# Patient Record
Sex: Female | Born: 1990
Health system: Southern US, Community
[De-identification: ages and names within clinical notes are randomized; demographics above are authoritative.]

## PROBLEM LIST (undated history)

## (undated) DIAGNOSIS — J45909 Unspecified asthma, uncomplicated: Secondary | ICD-10-CM

## (undated) DIAGNOSIS — J302 Other seasonal allergic rhinitis: Secondary | ICD-10-CM

---
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
## 2020-04-16 ENCOUNTER — Other Ambulatory Visit: Payer: Self-pay | Admitting: Critical Care Medicine

## 2020-04-16 ENCOUNTER — Other Ambulatory Visit: Payer: Self-pay

## 2020-04-16 DIAGNOSIS — Z20822 Contact with and (suspected) exposure to covid-19: Secondary | ICD-10-CM

## 2020-04-17 LAB — SARS-COV-2, NAA 2 DAY TAT

## 2020-04-17 LAB — NOVEL CORONAVIRUS, NAA: SARS-CoV-2, NAA: NOT DETECTED

## 2020-11-07 ENCOUNTER — Emergency Department (HOSPITAL_COMMUNITY)
Admission: EM | Admit: 2020-11-07 | Discharge: 2020-11-07 | Disposition: A | Discharge status: Home / Self Care | Payer: 59 | Attending: Emergency Medicine | Admitting: Emergency Medicine

## 2020-11-07 ENCOUNTER — Encounter (HOSPITAL_COMMUNITY): Payer: Self-pay

## 2020-11-07 ENCOUNTER — Other Ambulatory Visit: Payer: Self-pay

## 2020-11-07 DIAGNOSIS — R059 Cough, unspecified: Secondary | ICD-10-CM | POA: Diagnosis present

## 2020-11-07 DIAGNOSIS — Z20822 Contact with and (suspected) exposure to covid-19: Secondary | ICD-10-CM | POA: Insufficient documentation

## 2020-11-07 DIAGNOSIS — J45901 Unspecified asthma with (acute) exacerbation: Secondary | ICD-10-CM | POA: Diagnosis not present

## 2020-11-07 HISTORY — DX: Other seasonal allergic rhinitis: J30.2

## 2020-11-07 HISTORY — DX: Unspecified asthma, uncomplicated: J45.909

## 2020-11-07 LAB — RESP PANEL BY RT-PCR (FLU A&B, COVID) ARPGX2
Influenza A by PCR: NEGATIVE
Influenza B by PCR: NEGATIVE
SARS Coronavirus 2 by RT PCR: NEGATIVE

## 2020-11-07 MED ORDER — ALBUTEROL SULFATE HFA 108 (90 BASE) MCG/ACT IN AERS
2.0000 | INHALATION_SPRAY | Freq: Once | RESPIRATORY_TRACT | Status: AC
  Administered 2020-11-07: 2 via RESPIRATORY_TRACT
  Filled 2020-11-07: qty 6.7

## 2020-11-07 MED ORDER — ALBUTEROL SULFATE (2.5 MG/3ML) 0.083% IN NEBU
2.5000 mg | INHALATION_SOLUTION | Freq: Once | RESPIRATORY_TRACT | Status: AC
  Administered 2020-11-07: 2.5 mg via RESPIRATORY_TRACT

## 2020-11-07 MED ORDER — DEXAMETHASONE 10 MG/ML FOR PEDIATRIC ORAL USE
10.0000 mg | Freq: Once | INTRAMUSCULAR | Status: AC
  Administered 2020-11-07: 10 mg via ORAL

## 2020-11-07 MED ORDER — IPRATROPIUM-ALBUTEROL 0.5-2.5 (3) MG/3ML IN SOLN
3.0000 mL | Freq: Once | RESPIRATORY_TRACT | Status: AC
  Administered 2020-11-07: 3 mL via RESPIRATORY_TRACT
  Filled 2020-11-07: qty 3

## 2020-11-07 MED ORDER — PREDNISONE 20 MG PO TABS: 20.0000 mg | ORAL_TABLET | Freq: Two times a day (BID) | ORAL | 0 refills | Status: AC

## 2020-11-07 MED ORDER — ALBUTEROL SULFATE (2.5 MG/3ML) 0.083% IN NEBU
2.5000 mg | INHALATION_SOLUTION | Freq: Once | RESPIRATORY_TRACT | Status: AC
  Administered 2020-11-07: 2.5 mg via RESPIRATORY_TRACT
  Filled 2020-11-07: qty 3

## 2020-11-07 MED ORDER — ALBUTEROL SULFATE HFA 108 (90 BASE) MCG/ACT IN AERS
2.0000 | INHALATION_SPRAY | RESPIRATORY_TRACT | Status: DC | PRN
  Filled 2020-11-07: qty 6.7

## 2020-11-07 NOTE — ED Triage Notes (Signed)
Pt woke up to Sob and states "im having asthma flare up". Pt states she hasnt had one in years and has not taken any neb tx or inhaler.

## 2020-11-07 NOTE — Discharge Instructions (Addendum)
Call your primary care doctor or specialist as discussed in the next 2-3 days.   Return immediately back to the ER if:  Your symptoms worsen within the next 12-24 hours. You develop new symptoms such as new fevers, persistent vomiting, new pain, shortness of breath, or new weakness or numbness, or if you have any other concerns.  

## 2020-11-07 NOTE — ED Provider Notes (Signed)
[facility] EMERGENCY DEPARTMENT Provider Note   CSN: [id number] Arrival date & time: 11/07/20  4492     History Chief Complaint  Patient presents with  . Asthma    Erin Frank is a 30 y.o. female.  Patient presents chief complaint of asthma exacerbation.  She says she has had severe asthma episodes once every few years.  She has been doing well for the last several months and no longer using her inhaler at home.  States she has had a mild cough otherwise denies fevers.  No vomiting no diarrhea complaining of tightness when she takes a deep breath.        Past Medical History:  Diagnosis Date  . Asthma   . Seasonal allergies     There are no problems to display for this patient.   History reviewed. No pertinent surgical history.   OB History   No obstetric history on file.     History reviewed. No pertinent family history.  Social History   Tobacco Use  . Smoking status: Never Smoker  . Smokeless tobacco: Never Used  Substance Use Topics  . Alcohol use: Not Currently  . Drug use: Never    Home Medications Prior to Admission medications   Medication Sig Start Date End Date Taking? Authorizing Provider  predniSONE (DELTASONE) 20 MG tablet Take 1 tablet (20 mg total) by mouth 2 (two) times daily with a meal for 5 days. 11/07/20 11/12/20 Yes Cheryll Cockayne, MD    Allergies    Patient has no known allergies.  Review of Systems   Review of Systems  Constitutional: Negative for fever.  HENT: Negative for ear pain.   Eyes: Negative for pain.  Respiratory: Positive for shortness of breath.   Cardiovascular: Negative for chest pain.  Gastrointestinal: Negative for abdominal pain.  Genitourinary: Negative for flank pain.  Musculoskeletal: Negative for back pain.  Skin: Negative for rash.  Neurological: Negative for headaches.    Physical Exam Updated Vital Signs BP 120/71   Pulse (!) 129   Temp 98 F (36.7 C)   Resp (!) 25    Ht 5\' 5"  (1.651 m)   Wt 77.1 kg   SpO2 96%   BMI 28.29 kg/m   Physical Exam Constitutional:      General: She is not in acute distress.    Appearance: Normal appearance.  HENT:     Head: Normocephalic.     Nose: Nose normal.  Eyes:     Extraocular Movements: Extraocular movements intact.  Cardiovascular:     Rate and Rhythm: Normal rate.  Pulmonary:     Breath sounds: Wheezing present.  Musculoskeletal:        General: Normal range of motion.     Cervical back: Normal range of motion.  Neurological:     General: No focal deficit present.     Mental Status: She is alert. Mental status is at baseline.     ED Results / Procedures / Treatments   Labs (all labs ordered are listed, but only abnormal results are displayed) Labs Reviewed  RESP PANEL BY RT-PCR (FLU A&B, COVID) [id number]    EKG None  Radiology No results found.  Procedures .Critical Care Performed by: , MD Authorized by: Cheryll Cockayne, MD   Critical care provider statement:    Critical care time (minutes):  30   Critical care time was exclusive of:  Separately billable procedures and treating other patients   Critical  care was necessary to treat or prevent imminent or life-threatening deterioration of the following conditions:  Respiratory failure Comments:     Patient with moderate to severe asthma exacerbation requiring 3 breathing treatments.     Medications Ordered in ED Medications  albuterol (VENTOLIN HFA) 108 (90 Base) MCG/ACT inhaler 2 puff (has no administration in time range)  dexamethasone (DECADRON) 10 MG/ML injection for Pediatric ORAL use 10 mg (10 mg Oral Given 11/07/20 0834)  albuterol (VENTOLIN HFA) 108 (90 Base) MCG/ACT inhaler 2 puff (2 puffs Inhalation Given 11/07/20 0800)  ipratropium-albuterol (DUONEB) 0.5-2.5 (3) MG/3ML nebulizer solution 3 mL (3 mLs Nebulization Given 11/07/20 0937)  albuterol (PROVENTIL) (2.5 MG/3ML) 0.083% nebulizer solution 2.5 mg (2.5 mg  Nebulization Given 11/07/20 1002)  albuterol (PROVENTIL) (2.5 MG/3ML) 0.083% nebulizer solution 2.5 mg (2.5 mg Nebulization Given 11/07/20 1053)    ED Course  I have reviewed the triage vital signs and the nursing notes.  Pertinent labs & imaging results that were available during my care of the patient were reviewed by me and considered in my medical decision making (see chart for details).    MDM Rules/Calculators/A&P                          Patient presents with wheezes bilaterally.  Moderately increased work of breathing.  Given Decadron and Covid swab.  Given albuterol treatments with improvement.  Patient appears improved with multiple breathing treatments while in the ER.  Recommend outpatient follow-up with her doctor in 3 to 4 days.  Recommend immediate return for worsening symptoms or any additional concerns.  Final Clinical Impression(s) / ED Diagnoses Final diagnoses:  Severe asthma with exacerbation, unspecified whether persistent    Rx / DC Orders ED Discharge Orders         Ordered    predniSONE (DELTASONE) 20 MG tablet  2 times daily with meals        11/07/20 1055           [place], Eustace Moore, MD 11/07/20 1055

## 2021-08-12 ENCOUNTER — Encounter (HOSPITAL_COMMUNITY): Payer: Self-pay

## 2021-08-12 ENCOUNTER — Other Ambulatory Visit: Payer: Self-pay

## 2021-08-12 ENCOUNTER — Emergency Department (HOSPITAL_COMMUNITY): Payer: No Typology Code available for payment source

## 2021-08-12 ENCOUNTER — Emergency Department (HOSPITAL_COMMUNITY)
Admission: EM | Admit: 2021-08-12 | Discharge: 2021-08-12 | Discharge status: Against Medical Advice | Payer: No Typology Code available for payment source | Attending: Emergency Medicine | Admitting: Emergency Medicine

## 2021-08-12 DIAGNOSIS — Z5321 Procedure and treatment not carried out due to patient leaving prior to being seen by health care provider: Secondary | ICD-10-CM | POA: Diagnosis not present

## 2021-08-12 DIAGNOSIS — U071 COVID-19: Secondary | ICD-10-CM | POA: Diagnosis not present

## 2021-08-12 DIAGNOSIS — R059 Cough, unspecified: Secondary | ICD-10-CM | POA: Diagnosis present

## 2021-08-12 LAB — COMPREHENSIVE METABOLIC PANEL
ALT: 16 U/L (ref 0–44)
AST: 16 U/L (ref 15–41)
Albumin: 3.9 g/dL (ref 3.5–5.0)
Alkaline Phosphatase: 58 U/L (ref 38–126)
Anion gap: 10 (ref 5–15)
BUN: 8 mg/dL (ref 6–20)
CO2: 18 mmol/L — ABNORMAL LOW (ref 22–32)
Calcium: 9 mg/dL (ref 8.9–10.3)
Chloride: 106 mmol/L (ref 98–111)
Creatinine, Ser: 0.84 mg/dL (ref 0.44–1.00)
GFR, Estimated: >60 mL/min (ref >60)
Glucose, Bld: 95 mg/dL (ref 70–99)
Potassium: 4 mmol/L (ref 3.5–5.1)
Sodium: 134 mmol/L — ABNORMAL LOW (ref 135–145)
Total Bilirubin: 0.8 mg/dL (ref 0.3–1.2)
Total Protein: 7.8 g/dL (ref 6.5–8.1)

## 2021-08-12 LAB — CBC WITH DIFFERENTIAL/PLATELET
Abs Immature Granulocytes: 0.03 10*3/uL (ref 0.00–0.07)
Basophils Absolute: 0 10*3/uL (ref 0.0–0.1)
Basophils Relative: 1 %
Eosinophils Absolute: 0 10*3/uL (ref 0.0–0.5)
Eosinophils Relative: 1 %
HCT: 40.5 % (ref 36.0–46.0)
Hemoglobin: 14.2 g/dL (ref 12.0–15.0)
Immature Granulocytes: 1 %
Lymphocytes Relative: 7 %
Lymphs Abs: 0.4 10*3/uL — ABNORMAL LOW (ref 0.7–4.0)
MCH: 32.8 pg (ref 26.0–34.0)
MCHC: 35.1 g/dL (ref 30.0–36.0)
MCV: 93.5 fL (ref 80.0–100.0)
Monocytes Absolute: 0.4 10*3/uL (ref 0.1–1.0)
Monocytes Relative: 7 %
Neutro Abs: 4.9 10*3/uL (ref 1.7–7.7)
Neutrophils Relative %: 83 %
Platelets: 294 10*3/uL (ref 150–400)
RBC: 4.33 MIL/uL (ref 3.87–5.11)
RDW: 12.9 % (ref 11.5–15.5)
WBC: 5.8 10*3/uL (ref 4.0–10.5)
nRBC: 0 % (ref 0.0–0.2)

## 2021-08-12 LAB — RESP PANEL BY RT-PCR (FLU A&B, COVID) ARPGX2
Influenza A by PCR: NEGATIVE
Influenza B by PCR: NEGATIVE
SARS Coronavirus 2 by RT PCR: POSITIVE — AB

## 2021-08-12 LAB — TROPONIN I (HIGH SENSITIVITY): Troponin I (High Sensitivity): <2 ng/L (ref <18)

## 2021-08-12 LAB — I-STAT BETA HCG BLOOD, ED (MC, WL, AP ONLY): I-stat hCG, quantitative: <5 m[IU]/mL (ref <5)

## 2021-08-12 MED ORDER — ALBUTEROL SULFATE HFA 108 (90 BASE) MCG/ACT IN AERS
6.0000 | INHALATION_SPRAY | Freq: Once | RESPIRATORY_TRACT | Status: AC
  Administered 2021-08-12: 13:00:00 6 via RESPIRATORY_TRACT
  Filled 2021-08-12: qty 6.7

## 2021-08-12 MED ORDER — IPRATROPIUM-ALBUTEROL 0.5-2.5 (3) MG/3ML IN SOLN
3.0000 mL | Freq: Once | RESPIRATORY_TRACT | Status: DC
  Filled 2021-08-12: qty 3

## 2021-08-12 MED ORDER — ACETAMINOPHEN 325 MG PO TABS
650.0000 mg | ORAL_TABLET | Freq: Once | ORAL | Status: AC
  Administered 2021-08-12: 13:00:00 650 mg via ORAL
  Filled 2021-08-12: qty 2

## 2021-08-12 MED ORDER — ALBUTEROL SULFATE (2.5 MG/3ML) 0.083% IN NEBU
2.5000 mg | INHALATION_SOLUTION | Freq: Once | RESPIRATORY_TRACT | Status: DC
  Filled 2021-08-12: qty 3

## 2021-08-12 MED ORDER — AEROCHAMBER PLUS FLO-VU LARGE MISC: 1.0000 | Freq: Once | Status: DC

## 2021-08-12 NOTE — ED Notes (Signed)
Called pt x3 for vitals recheck, no response.  ?

## 2021-08-12 NOTE — ED Notes (Signed)
Called pt again x3 for vitals recheck, still no response. Moving pt OTF. 

## 2021-08-12 NOTE — ED Triage Notes (Signed)
Patient with cough and congestion that started yesterday and this am complains of asthma exacerbation. Used inhaler with ,minimal relief. Complains of chills with same. Alert and oriented

## 2021-08-12 NOTE — ED Provider Notes (Signed)
Emergency Medicine Provider Triage Evaluation Note  Erin Frank , a 30 y.o. female  was evaluated in triage.  Pt complains of feeling poorly. She states that she had cough and congestion that started yesterday.  This morning at about 8 she used her asthma inhaler, she is 2 puffs without relief.  She reports chills.  She has not had flu or COVID and is not vaccinated against either.  She states this feels different than an asthma exacerbation.  She reports generalized body aches.  Review of Systems  Positive: Shortness of breath, lightheadedness body aches Negative: Abdominal pain  Physical Exam  BP 115/77 (BP Location: Left Arm)    Pulse (!) 107    Temp 99.6 F (37.6 C) (Oral)    Resp 20    SpO2 99%  Gen:   Awake, no distress   Resp:  Increased effort.  Lung sounds are not significantly diminished and there is not significant wheezing bilaterally MSK:   Moves extremities without difficulty  Other:  Patient appears to feel unwell.  Medical Decision Making  Medically screening exam initiated at 1:01 PM.  Appropriate orders placed.  Erin Frank was informed that the remainder of the evaluation will be completed by another provider, this initial triage assessment does not replace that evaluation, and the importance of remaining in the ED until their evaluation is complete.  States that she started feeling poorly last night with cough and congestion.  Here she is tachycardic, her temperature is 99.6, given is the rest of her symptoms seem consistent with a influenza-like illness I question if she is actually febrile. Her lungs do not have significant diminished airflow or wheezing bilaterally and she states this feels different than her usual asthma exacerbations. We will give multiple puffs of albuterol inhaler with spacer ordered.  Additionally will treat with Tylenol as a suspect she may be febrile.  Labs and chest x-ray are ordered.  Flu and COVID testing is ordered.    Note:  Portions of this report may have been transcribed using voice recognition software. Every effort was made to ensure accuracy; however, inadvertent computerized transcription errors may be present     Erin Frank 08/12/21 1304    Wynetta Fines, MD 08/14/21 [phone]

## 2022-01-19 ENCOUNTER — Other Ambulatory Visit: Payer: Self-pay | Admitting: Obstetrics and Gynecology

## 2022-01-19 ENCOUNTER — Other Ambulatory Visit (HOSPITAL_COMMUNITY)
Admission: RE | Admit: 2022-01-19 | Discharge: 2022-01-19 | Disposition: A | Discharge status: Home / Self Care | Payer: No Typology Code available for payment source | Source: Ambulatory Visit | Attending: Obstetrics and Gynecology | Admitting: Obstetrics and Gynecology

## 2022-01-19 DIAGNOSIS — Z01419 Encounter for gynecological examination (general) (routine) without abnormal findings: Secondary | ICD-10-CM | POA: Diagnosis present

## 2022-01-21 LAB — CYTOLOGY - PAP
Comment: NEGATIVE
Diagnosis: NEGATIVE
High risk HPV: NEGATIVE

## 2022-05-29 ENCOUNTER — Emergency Department (HOSPITAL_BASED_OUTPATIENT_CLINIC_OR_DEPARTMENT_OTHER)
Admission: EM | Admit: 2022-05-29 | Discharge: 2022-05-29 | Disposition: A | Discharge status: Home / Self Care | Payer: No Typology Code available for payment source | Attending: Emergency Medicine | Admitting: Emergency Medicine

## 2022-05-29 ENCOUNTER — Other Ambulatory Visit: Payer: Self-pay

## 2022-05-29 ENCOUNTER — Encounter (HOSPITAL_BASED_OUTPATIENT_CLINIC_OR_DEPARTMENT_OTHER): Payer: Self-pay

## 2022-05-29 DIAGNOSIS — R0602 Shortness of breath: Secondary | ICD-10-CM | POA: Diagnosis present

## 2022-05-29 DIAGNOSIS — J4541 Moderate persistent asthma with (acute) exacerbation: Secondary | ICD-10-CM | POA: Insufficient documentation

## 2022-05-29 MED ORDER — ALBUTEROL SULFATE HFA 108 (90 BASE) MCG/ACT IN AERS: 2.0000 | INHALATION_SPRAY | RESPIRATORY_TRACT | Status: DC | PRN

## 2022-05-29 MED ORDER — AEROCHAMBER PLUS FLO-VU MISC
1.0000 | Freq: Once | Status: AC
  Administered 2022-05-29: 1
  Filled 2022-05-29: qty 1

## 2022-05-29 MED ORDER — IPRATROPIUM-ALBUTEROL 0.5-2.5 (3) MG/3ML IN SOLN: 3.0000 mL | Freq: Once | RESPIRATORY_TRACT | Status: AC

## 2022-05-29 MED ORDER — ALBUTEROL SULFATE HFA 108 (90 BASE) MCG/ACT IN AERS
INHALATION_SPRAY | RESPIRATORY_TRACT | Status: AC
  Administered 2022-05-29: 2 via RESPIRATORY_TRACT
  Filled 2022-05-29: qty 6.7

## 2022-05-29 MED ORDER — DEXAMETHASONE 4 MG PO TABS
10.0000 mg | ORAL_TABLET | Freq: Once | ORAL | Status: AC
  Administered 2022-05-29: 10 mg via ORAL
  Filled 2022-05-29: qty 3

## 2022-05-29 MED ORDER — IPRATROPIUM-ALBUTEROL 0.5-2.5 (3) MG/3ML IN SOLN
RESPIRATORY_TRACT | Status: AC
  Administered 2022-05-29: 3 mL via RESPIRATORY_TRACT
  Filled 2022-05-29: qty 3

## 2022-05-29 NOTE — ED Notes (Signed)
RT educated pt on proper use of MDI w/spacer and discussed the need for PFT w/pulmonology for current data and updated treatment options. Pt verbalized understanding of treatment and administration technique.

## 2022-05-29 NOTE — ED Provider Notes (Signed)
[facility] EMERGENCY DEPT Provider Note  CSN: [id number] Arrival date & time: 05/29/22 7510  Chief Complaint(s) Asthma  HPI Erin Frank is a 31 y.o. female with a past medical history listed below including seasonal allergies and asthma who presents to the emergency department for asthma exacerbation due to seasonal allergy exacerbation she reports that she began sneezing severely tonight which caused her to feel short of breath and wheezy.  She denies any fevers and chills.  No coughing or congestion.  No chest pain.  Physical complaints.  In triage patient was noted to have expiratory wheezes by respiratory therapy and was given a DuoNeb and 2 puffs of albuterol.  The history is provided by the patient.    Past Medical History Past Medical History:  Diagnosis Date   Asthma    Seasonal allergies    There are no problems to display for this patient.  Home Medication(s) Prior to Admission medications   Not on File                                                                                                                                    Allergies Patient has no known allergies.  Review of Systems Review of Systems As noted in HPI  Physical Exam Vital Signs  I have reviewed the triage vital signs BP (!) 140/85   Pulse 100   Temp 98 F (36.7 C) (Oral)   Resp (!) 22   Ht 5\' 5"  (1.651 m)   Wt 81.6 kg   LMP 05/05/2022 (Approximate)   SpO2 99%   BMI 29.95 kg/m   Physical Exam Vitals reviewed.  Constitutional:      General: She is not in acute distress.    Appearance: She is well-developed. She is not diaphoretic.  HENT:     Head: Normocephalic and atraumatic.     Nose: Nose normal.  Eyes:     General: No scleral icterus.       Right eye: No discharge.        Left eye: No discharge.     Conjunctiva/sclera: Conjunctivae normal.     Pupils: Pupils are equal, round, and reactive to light.  Cardiovascular:     Rate and Rhythm: Normal  rate and regular rhythm.     Heart sounds: No murmur heard.    No friction rub. No gallop.  Pulmonary:     Effort: Pulmonary effort is normal. No respiratory distress.     Breath sounds: Normal breath sounds. No stridor. No wheezing, rhonchi or rales.  Abdominal:     General: There is no distension.     Palpations: Abdomen is soft.     Tenderness: There is no abdominal tenderness.  Musculoskeletal:        General: No tenderness.     Cervical back: Normal range of motion and neck supple.  Skin:    General: Skin is warm and dry.  Findings: No erythema or rash.  Neurological:     Mental Status: She is alert and oriented to person, place, and time.     ED Results and Treatments Labs (all labs ordered are listed, but only abnormal results are displayed) Labs Reviewed - No data to display                                                                                                                       EKG  EKG Interpretation  Date/Time:    Ventricular Rate:    PR Interval:    QRS Duration:   QT Interval:    QTC Calculation:   R Axis:     Text Interpretation:         Radiology No results found.  Medications Ordered in ED Medications  albuterol (VENTOLIN HFA) 108 (90 Base) MCG/ACT inhaler 2 puff (2 puffs Inhalation Given 05/29/22 0535)  dexamethasone (DECADRON) tablet 10 mg (has no administration in time range)  ipratropium-albuterol (DUONEB) 0.5-2.5 (3) MG/3ML nebulizer solution 3 mL (3 mLs Nebulization Given 05/29/22 0536)  aerochamber plus with mask device 1 each (1 each Other Given 05/29/22 0535)                                                                                                                                     Procedures Procedures  (including critical care time)  Medical Decision Making / ED Course   Medical Decision Making Risk Prescription drug management.    Asthma exacerbation. On my assessment patient had no wheezing or dose of  DuoNeb 2 puffs of albuterol. Lungs were clear auscultation bilaterally without rales or rhonchi. Doubt pneumonia. Patient monitored for additional hours without recurrence of symptoms. Dose of decadron given.     Final Clinical Impression(s) / ED Diagnoses Final diagnoses:  Moderate persistent asthma with acute exacerbation  The patient appears reasonably screened and/or stabilized for discharge and I doubt any other medical condition or other [facility] requiring further screening, evaluation, or treatment in the ED at this time. I have discussed the findings, Dx and Tx plan with the patient/family who expressed understanding and agree(s) with the plan. Discharge instructions discussed at length. The patient/family was given strict return precautions who verbalized understanding of the instructions. No further questions at time of discharge.  Disposition: Discharge  Condition: Good  ED Discharge Orders     None  Follow Up: Primary care provider  Call  to schedule an appointment for close follow up            This chart was dictated using voice recognition software.  Despite best efforts to proofread,  errors can occur which can change the documentation meaning.    Nira Conn, MD 05/29/22 [phone]

## 2022-05-29 NOTE — ED Triage Notes (Signed)
Has had asthma related breathing issues and wheezing since last night. Out of rx albuterol inhaler and has been using an allegra inhaler with no improvement.

## 2022-09-03 IMAGING — CR DG CHEST 2V
2 series · 2 of 2 positions shown · non-contrast
Comparison: 12/05/2005

CLINICAL DATA: Shortness of breath. Cough and congestion beginning
yesterday.

EXAM:
CHEST - 2 VIEW

[chest lat]
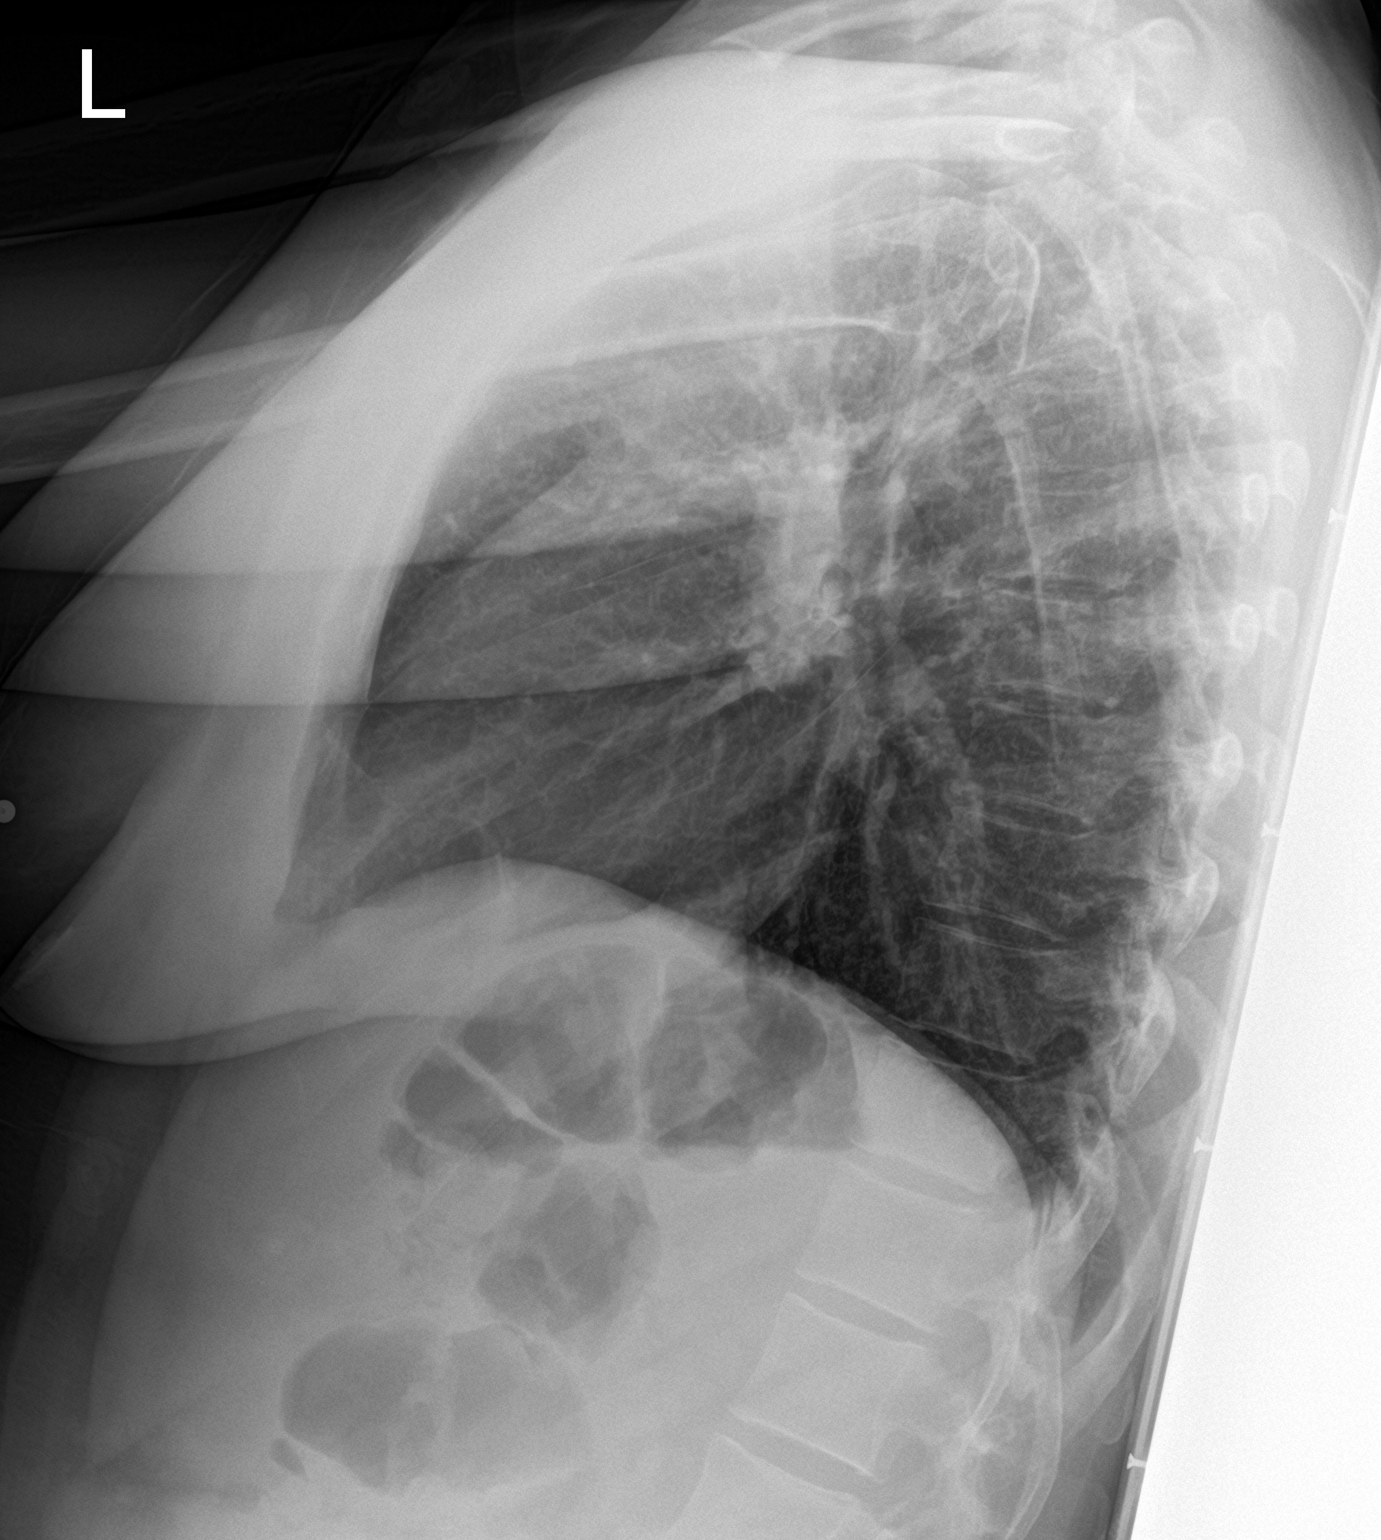

[chest ap]
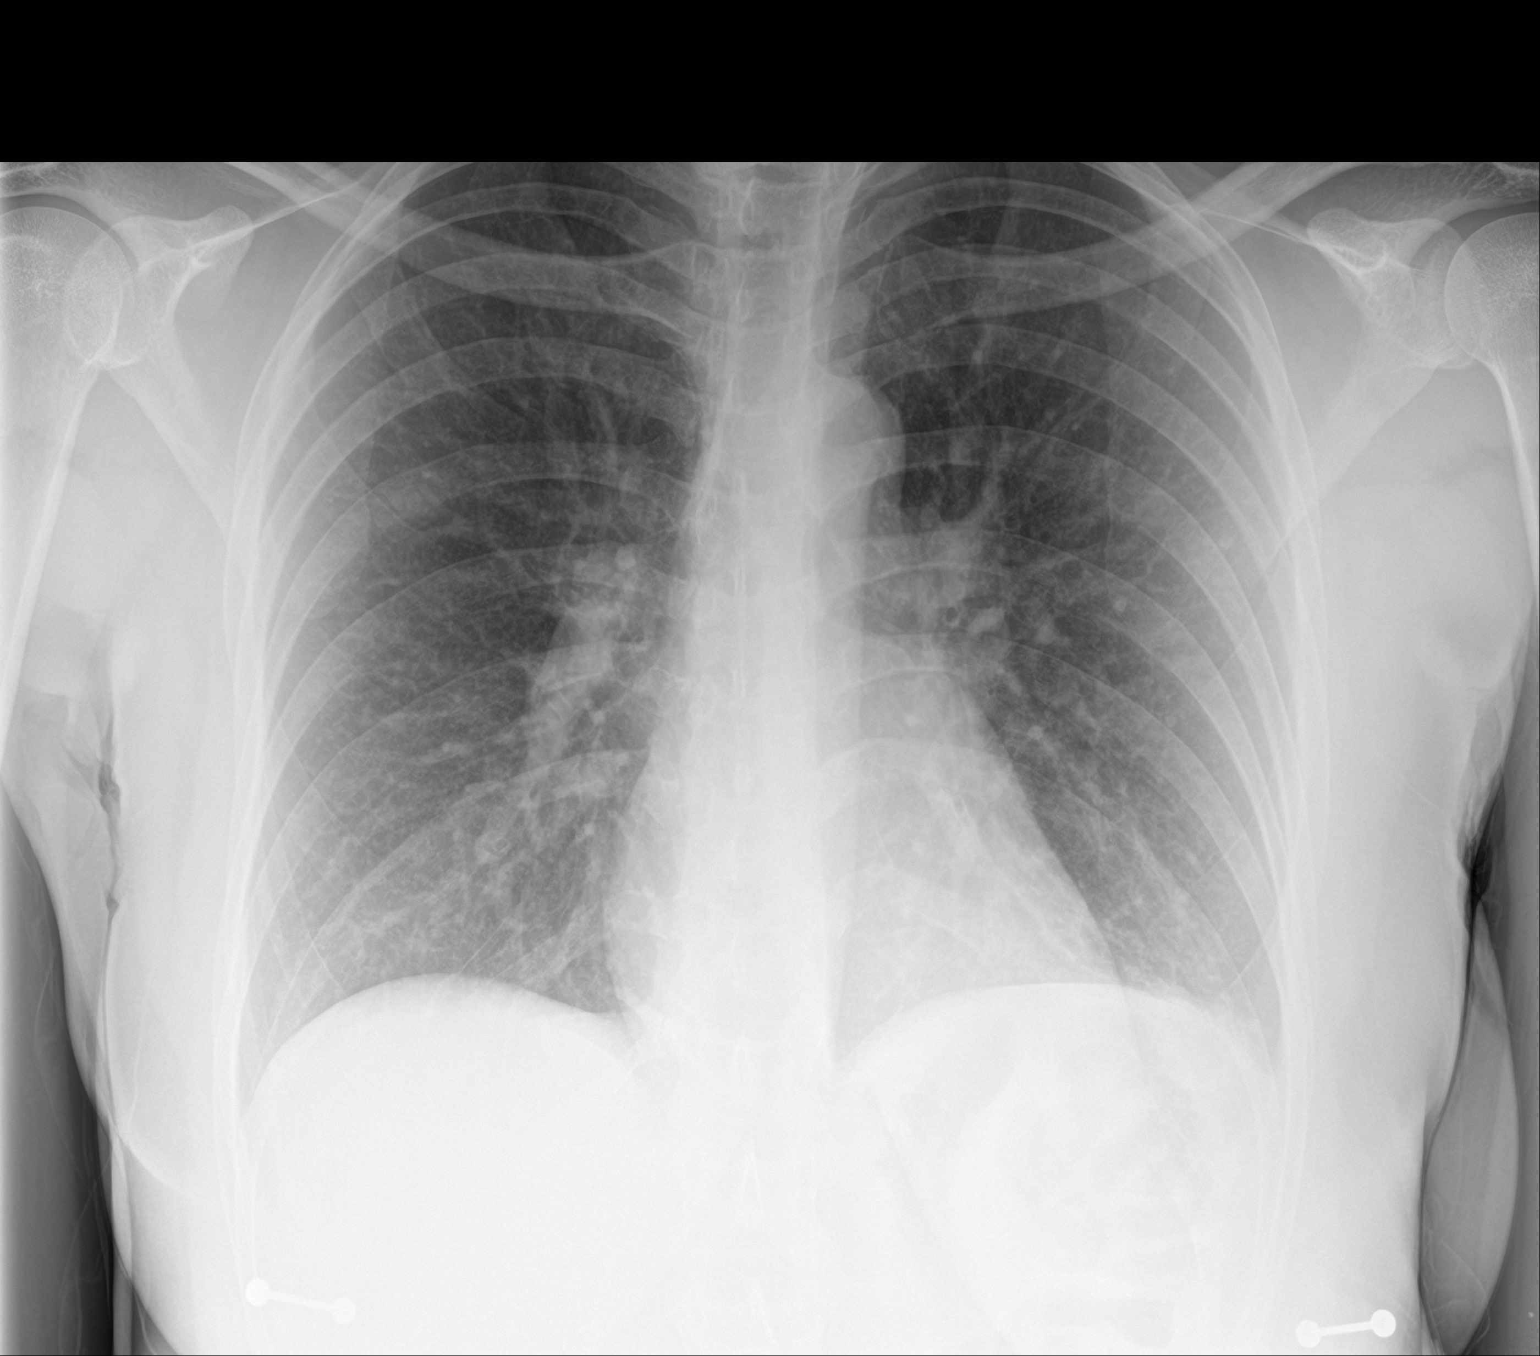

[2 of 2 positions shown; findings below may reference images not displayed]

FINDINGS: Heart size is normal. Mediastinal shadows are normal. The right lung
is clear. Question minimal patchy infiltrate at the left base or
lingula. No dense consolidation or lobar collapse. No effusion. No
abnormal bone finding.
IMPRESSION: Minimal patchy infiltrate at the left base/lingula suggesting mild
pneumonia.

## 2023-08-11 ENCOUNTER — Emergency Department (HOSPITAL_BASED_OUTPATIENT_CLINIC_OR_DEPARTMENT_OTHER)
Admission: EM | Admit: 2023-08-11 | Discharge: 2023-08-11 | Disposition: A | Discharge status: Home / Self Care | Payer: No Typology Code available for payment source | Attending: Emergency Medicine | Admitting: Emergency Medicine

## 2023-08-11 ENCOUNTER — Emergency Department (HOSPITAL_BASED_OUTPATIENT_CLINIC_OR_DEPARTMENT_OTHER): Payer: No Typology Code available for payment source

## 2023-08-11 ENCOUNTER — Encounter (HOSPITAL_BASED_OUTPATIENT_CLINIC_OR_DEPARTMENT_OTHER): Payer: Self-pay

## 2023-08-11 ENCOUNTER — Other Ambulatory Visit: Payer: Self-pay

## 2023-08-11 DIAGNOSIS — J45909 Unspecified asthma, uncomplicated: Secondary | ICD-10-CM | POA: Insufficient documentation

## 2023-08-11 DIAGNOSIS — R0602 Shortness of breath: Secondary | ICD-10-CM | POA: Diagnosis present

## 2023-08-11 DIAGNOSIS — J4541 Moderate persistent asthma with (acute) exacerbation: Secondary | ICD-10-CM

## 2023-08-11 MED ORDER — PREDNISONE 20 MG PO TABS
40.0000 mg | ORAL_TABLET | Freq: Once | ORAL | Status: AC
  Administered 2023-08-11: 40 mg via ORAL
  Filled 2023-08-11: qty 2

## 2023-08-11 MED ORDER — ALBUTEROL SULFATE HFA 108 (90 BASE) MCG/ACT IN AERS
2.0000 | INHALATION_SPRAY | RESPIRATORY_TRACT | Status: DC | PRN
  Filled 2023-08-11: qty 6.7

## 2023-08-11 MED ORDER — PREDNISONE 10 MG PO TABS: 20.0000 mg | ORAL_TABLET | Freq: Two times a day (BID) | ORAL | 0 refills | Status: AC

## 2023-08-11 MED ORDER — ALBUTEROL SULFATE (2.5 MG/3ML) 0.083% IN NEBU
2.5000 mg | INHALATION_SOLUTION | Freq: Once | RESPIRATORY_TRACT | Status: AC
  Administered 2023-08-11: 2.5 mg via RESPIRATORY_TRACT
  Filled 2023-08-11: qty 3

## 2023-08-11 MED ORDER — IPRATROPIUM-ALBUTEROL 0.5-2.5 (3) MG/3ML IN SOLN
3.0000 mL | Freq: Once | RESPIRATORY_TRACT | Status: AC
  Administered 2023-08-11: 3 mL via RESPIRATORY_TRACT
  Filled 2023-08-11: qty 3

## 2023-08-11 MED ORDER — ALBUTEROL SULFATE HFA 108 (90 BASE) MCG/ACT IN AERS
2.0000 | INHALATION_SPRAY | Freq: Once | RESPIRATORY_TRACT | Status: AC
Start: 2023-08-11 — End: 2023-08-11
  Administered 2023-08-11: 2 via RESPIRATORY_TRACT

## 2023-08-11 NOTE — Discharge Instructions (Signed)
Your x-ray is clear and shows no evidence for pneumonia or other abnormality.  Begin taking prednisone as prescribed.  Use the albuterol inhaler 2 puffs every 4 hours as needed for wheezing.  Return to the emergency department if your symptoms significantly worsen or change.

## 2023-08-11 NOTE — ED Provider Notes (Signed)
[facility] EMERGENCY DEPARTMENT AT [facility] Provider Note   CSN: [id number] Arrival date & time: 08/11/23  0206     History  Chief Complaint  Patient presents with   Shortness of Breath    Erin Frank is a 32 y.o. female.  Patient is a 32 year old female presenting with complaints of wheezing and shortness of breath.  This has been occurring intermittently for the past several weeks, but worsened tonight.  She has an inhaler at home, but this is now empty.  She denies any fevers or chills.  Cough is intermittently productive.  The history is provided by the patient.       Home Medications Prior to Admission medications   Not on File      Allergies    Patient has no known allergies.    Review of Systems   Review of Systems  All other systems reviewed and are negative.   Physical Exam Updated Vital Signs BP 131/83   Pulse 91   Temp 98.2 F (36.8 C) (Oral)   Resp 20   Ht 5\' 5"  (1.651 m)   Wt 79.4 kg   SpO2 98%   BMI 29.12 kg/m  Physical Exam Vitals and nursing note reviewed.  Constitutional:      General: She is not in acute distress.    Appearance: She is well-developed. She is not diaphoretic.  HENT:     Head: Normocephalic and atraumatic.  Cardiovascular:     Rate and Rhythm: Normal rate and regular rhythm.     Heart sounds: No murmur heard.    No friction rub. No gallop.  Pulmonary:     Effort: Pulmonary effort is normal. No respiratory distress.     Breath sounds: Examination of the right-middle field reveals wheezing. Examination of the left-middle field reveals wheezing. Wheezing present.     Comments: There are mild scattered wheezes bilaterally. Abdominal:     General: Bowel sounds are normal. There is no distension.     Palpations: Abdomen is soft.     Tenderness: There is no abdominal tenderness.  Musculoskeletal:        General: Normal range of motion.     Cervical back: Normal range of motion and neck supple.  Skin:     General: Skin is warm and dry.  Neurological:     General: No focal deficit present.     Mental Status: She is alert and oriented to person, place, and time.     ED Results / Procedures / Treatments   Labs (all labs ordered are listed, but only abnormal results are displayed) Labs Reviewed - No data to display  EKG EKG Interpretation Date/Time:  Thursday August 11 2023 02:24:24 EST Ventricular Rate:  94 PR Interval:  125 QRS Duration:  77 QT Interval:  357 QTC Calculation: 447 R Axis:   55  Text Interpretation: Sinus rhythm ST elev, probable normal early repol pattern No significant change since 08/12/2021 Confirmed by Geoffery Lyons ([id number]) on 08/11/2023 2:29:33 AM  Radiology DG Chest Port 1 View Result Date: 08/11/2023 CLINICAL DATA:  Shortness of breath EXAM: PORTABLE CHEST 1 VIEW COMPARISON:  08/12/2021 FINDINGS: The heart size and mediastinal contours are within normal limits. Both lungs are clear. The visualized skeletal structures are unremarkable. IMPRESSION: No active disease. Electronically Signed   By: Charlett Nose M.D.   On: 08/11/2023 03:04    Procedures Procedures    Medications Ordered in ED Medications  albuterol (VENTOLIN HFA) 108 (90 Base)  MCG/ACT inhaler 2 puff (has no administration in time range)  predniSONE (DELTASONE) tablet 40 mg (has no administration in time range)  albuterol (VENTOLIN HFA) 108 (90 Base) MCG/ACT inhaler 2 puff (has no administration in time range)  ipratropium-albuterol (DUONEB) 0.5-2.5 (3) MG/3ML nebulizer solution 3 mL (3 mLs Nebulization Given 08/11/23 0222)  albuterol (PROVENTIL) (2.5 MG/3ML) 0.083% nebulizer solution 2.5 mg (2.5 mg Nebulization Given 08/11/23 0222)    ED Course/ Medical Decision Making/ A&P  Patient is a 32 year old female presenting with wheezing and difficulty breathing as described in the HPI.  She has a history of asthma and this appears to be a flareup of this.  She arrives here with stable vital  signs and is afebrile.  There is no hypoxia.  She did initially have wheezing on exam, but greatly improved after receiving a nebulizer treatment.  Patient to be treated with prednisone and continued use of her inhaler for what seems like a flareup of her asthma.  Chest x-ray shows no acute infiltrate or other process.  Final Clinical Impression(s) / ED Diagnoses Final diagnoses:  None    Rx / DC Orders ED Discharge Orders     None         Geoffery Lyons, MD 08/11/23 [phone]

## 2023-08-11 NOTE — ED Triage Notes (Signed)
Pt reports having cough and postnasal drip past few days but started having worsened cough and SOB today. Hx of asthma. Pt out of albuterol inhaler. Pt's breathing labored, wheezing audible while resting.

## 2023-09-21 ENCOUNTER — Ambulatory Visit: Payer: No Typology Code available for payment source | Admitting: Family Medicine

## 2023-09-21 NOTE — Progress Notes (Deleted)
New Patient Office Visit  Subjective    Patient ID: Erin Frank, female    DOB: 1990/10/07  Age: 33 y.o. MRN: [id number]  CC: No chief complaint on file.   HPI Erin Frank presents to establish care ***  Outpatient Encounter Medications as of 09/21/2023  Medication Sig  . predniSONE (DELTASONE) 10 MG tablet Take 2 tablets (20 mg total) by mouth 2 (two) times daily with a meal.   No facility-administered encounter medications on file as of 09/21/2023.    Past Medical History:  Diagnosis Date  . Asthma   . Seasonal allergies     No past surgical history on file.  No family history on file.  Social History   Socioeconomic History  . Marital status: Single    Spouse name: Not on file  . Number of children: Not on file  . Years of education: Not on file  . Highest education level: Not on file  Occupational History  . Not on file  Tobacco Use  . Smoking status: Never  . Smokeless tobacco: Never  Substance and Sexual Activity  . Alcohol use: Not Currently  . Drug use: Never  . Sexual activity: Not on file  Other Topics Concern  . Not on file  Social History Narrative  . Not on file   Social Drivers of Health   Financial Resource Strain: Not on file  Food Insecurity: Not on file  Transportation Needs: Not on file  Physical Activity: Not on file  Stress: Not on file  Social Connections: Not on file  Intimate Partner Violence: Unknown (12/04/2021)   Received from [facility]   HITS   . Physically Hurt: Not on file   . Insult or Talk Down To: Not on file   . Threaten Physical Harm: Not on file   . Scream or Curse: Not on file    ROS Per HPI      Objective    There were no vitals taken for this visit.  Physical Exam Vitals and nursing note reviewed.  Constitutional:      Appearance: Normal appearance.  HENT:     Head: Normocephalic.     Right Ear: Tympanic membrane and ear canal normal.     Left Ear: Tympanic  membrane and ear canal normal.     Nose: Nose normal.     Mouth/Throat:     Mouth: Mucous membranes are moist.     Pharynx: Oropharynx is clear.  Eyes:     Extraocular Movements: Extraocular movements intact.     Conjunctiva/sclera: Conjunctivae normal.     Pupils: Pupils are equal, round, and reactive to light.  Neck:     Thyroid: No thyromegaly.  Cardiovascular:     Rate and Rhythm: Normal rate and regular rhythm.     Pulses: Normal pulses.  Pulmonary:     Effort: Pulmonary effort is normal.     Breath sounds: Normal breath sounds.  Abdominal:     General: Abdomen is flat. Bowel sounds are normal.     Palpations: Abdomen is soft.  Musculoskeletal:        General: Normal range of motion.     Cervical back: Normal range of motion and neck supple.  Lymphadenopathy:     Cervical: No cervical adenopathy.  Skin:    General: Skin is warm and dry.     Capillary Refill: Capillary refill takes less than 2 seconds.  Neurological:     General: No focal  deficit present.     Mental Status: She is alert and oriented to person, place, and time.  Psychiatric:        Mood and Affect: Mood normal.        Behavior: Behavior normal.       Assessment & Plan:   There are no diagnoses linked to this encounter.   No follow-ups on file.   Moshe Cipro, FNP

## 2023-10-15 ENCOUNTER — Telehealth: Payer: No Typology Code available for payment source

## 2024-02-08 ENCOUNTER — Emergency Department (HOSPITAL_BASED_OUTPATIENT_CLINIC_OR_DEPARTMENT_OTHER)
Admission: EM | Admit: 2024-02-08 | Discharge: 2024-02-08 | Disposition: A | Discharge status: Home / Self Care | Attending: Emergency Medicine | Admitting: Emergency Medicine

## 2024-02-08 ENCOUNTER — Other Ambulatory Visit: Payer: Self-pay

## 2024-02-08 ENCOUNTER — Emergency Department (HOSPITAL_BASED_OUTPATIENT_CLINIC_OR_DEPARTMENT_OTHER)

## 2024-02-08 ENCOUNTER — Encounter (HOSPITAL_BASED_OUTPATIENT_CLINIC_OR_DEPARTMENT_OTHER): Payer: Self-pay

## 2024-02-08 DIAGNOSIS — W25XXXA Contact with sharp glass, initial encounter: Secondary | ICD-10-CM | POA: Insufficient documentation

## 2024-02-08 DIAGNOSIS — S61411A Laceration without foreign body of right hand, initial encounter: Secondary | ICD-10-CM | POA: Diagnosis not present

## 2024-02-08 MED ORDER — LIDOCAINE HCL (PF) 1 % IJ SOLN
5.0000 mL | Freq: Once | INTRAMUSCULAR | Status: AC
  Administered 2024-02-08: 5 mL
  Filled 2024-02-08: qty 5

## 2024-02-08 NOTE — ED Triage Notes (Signed)
 Pt c/o split finger trying to get glass bowls down from cabinet. Bleeding controlled at time of triage, increased pain w movement. + cap refill, pulses, ROM

## 2024-02-08 NOTE — Discharge Instructions (Addendum)
 Keep area clean dry and covered.  You can wash with soap and water but do not submerge in water.  Use Neosporin or bacitracin twice a day.  Please follow-up for suture removal in 10 days with primary care or urgent care.  Return to emergency room if you have fever or signs of infection.

## 2024-02-08 NOTE — ED Provider Notes (Signed)
 [facility] Provider Note   CSN: [id number] Arrival date & time: 02/08/24  1648     History  Chief Complaint  Patient presents with   Laceration    Erin Frank is a 33 y.o. female.  No significant past medical history reporting to emergency room after she dropped a glass bowl on her hand. Laceration to palm on right hand. She has updated tetanus shot.  Moving extremities and fingers without difficulty.  Sensation and strength intact. No blood thinners.    Laceration      Home Medications Prior to Admission medications   Medication Sig Start Date End Date Taking? Authorizing Provider  predniSONE  (DELTASONE ) 10 MG tablet Take 2 tablets (20 mg total) by mouth 2 (two) times daily with a meal. 08/11/23   Orvilla Blander, MD      Allergies    Patient has no known allergies.    Review of Systems   Review of Systems  Physical Exam Updated Vital Signs BP 126/83   Pulse 73   Temp 98.6 F (37 C)   Resp 16   SpO2 98%  Physical Exam Vitals and nursing note reviewed.  Constitutional:      General: She is not in acute distress.    Appearance: She is not toxic-appearing.  HENT:     Head: Normocephalic and atraumatic.  Eyes:     General: No scleral icterus.    Conjunctiva/sclera: Conjunctivae normal.  Cardiovascular:     Rate and Rhythm: Normal rate and regular rhythm.     Pulses: Normal pulses.     Heart sounds: Normal heart sounds.  Pulmonary:     Effort: Pulmonary effort is normal. No respiratory distress.     Breath sounds: Normal breath sounds.  Abdominal:     General: Abdomen is flat. Bowel sounds are normal.     Palpations: Abdomen is soft.     Tenderness: There is no abdominal tenderness.  Skin:    General: Skin is warm and dry.     Findings: No lesion.     Comments: 3 cm laceration to right hand between 4th and 5th digit  Neurological:     General: No focal deficit present.     Mental Status: She is alert  and oriented to person, place, and time. Mental status is at baseline.     ED Results / Procedures / Treatments   Labs (all labs ordered are listed, but only abnormal results are displayed) Labs Reviewed - No data to display  EKG None  Radiology DG Hand Complete Right Result Date: 02/08/2024 CLINICAL DATA:  Laceration of the fifth digit. EXAM: RIGHT HAND - COMPLETE 3+ VIEW COMPARISON:  None Available. FINDINGS: There is no evidence of fracture or dislocation. There is no evidence of arthropathy or other focal bone abnormality. Soft tissue swelling of the fifth digit. No radiopaque foreign body. IMPRESSION: 1. Soft tissue swelling of the fifth digit. No radiopaque foreign body. 2. No acute osseous abnormality. Electronically Signed   By: Mannie Seek M.D.   On: 02/08/2024 17:40    Procedures .Laceration Repair  Date/Time: 02/08/2024 7:15 PM  Performed by: Eudora Heron, PA-C Authorized by: Eudora Heron, PA-C   Consent:    Consent obtained:  Verbal   Consent given by:  Patient   Risks, benefits, and alternatives were discussed: yes     Risks discussed:  Infection, pain, retained foreign body, tendon damage, vascular damage, poor wound healing, poor cosmetic  result, need for additional repair and nerve damage   Alternatives discussed:  No treatment, delayed treatment, observation and referral Universal protocol:    Procedure explained and questions answered to patient or proxy's satisfaction: yes     Relevant documents present and verified: yes     Test results available: yes     Imaging studies available: yes     Required blood products, implants, devices, and special equipment available: yes     Site/side marked: yes     Immediately prior to procedure, a time out was called: yes     Patient identity confirmed:  Verbally with patient Anesthesia:    Anesthesia method:  Local infiltration   Local anesthetic:  Lidocaine 1% w/o epi Laceration details:    Location:   Hand   Hand location:  R palm   Length (cm):  3 Pre-procedure details:    Preparation:  Patient was prepped and draped in usual sterile fashion and imaging obtained to evaluate for foreign bodies Exploration:    Wound extent: no signs of injury, no underlying fracture and no vascular damage     Contaminated: no   Treatment:    Area cleansed with:  Povidone-iodine   Amount of cleaning:  Standard   Irrigation solution:  Sterile saline   Irrigation method:  Pressure wash   Debridement:  None   Undermining:  None Skin repair:    Repair method:  Sutures   Suture size:  5-0   Suture material:  Nylon   Suture technique:  Simple interrupted   Number of sutures:  4 Approximation:    Approximation:  Close Repair type:    Repair type:  Simple Post-procedure details:    Dressing:  Antibiotic ointment and non-adherent dressing   Procedure completion:  Tolerated     Medications Ordered in ED Medications  lidocaine (PF) (XYLOCAINE) 1 % injection 5 mL (5 mLs Other Given by Other 02/08/24 1723)    ED Course/ Medical Decision Making/ A&P                                 Medical Decision Making Amount and/or Complexity of Data Reviewed Radiology: ordered.  Risk Prescription drug management.   Erin Frank 33 y.o. presented today for a 3 cm laceration to their right hand. Working Ddx: FB, fracture, NV compromise, simple laceration.  R/o DDx: These ddx are considered less likely due to history of present illness and physical exam findings.  Pmhx considered:   Patient presented for a 3 cm laceration to their right hand. They are neurovascularly intact. Tetanus is UTD. Patient is in no distress. Laceration will be repaired with standard wound care procedures and antibiotic ointment.    Imaging: right hand without acute finding or FB   Problem List / ED Course / Critical interventions / Medication management  Patient presented to emergency room with right hand laceration.   Laceration is located in the webspace between her right 4th and 5th digit.  Extent of the wound was visualized without any obvious foreign body, tendon or vessel damage.  She is neurovascularly intact.  X-ray shows no foreign body.  Her tetanus shot was up-to-date.  Laceration was repaired without any complication. I ordered medication including Lidocaine.  Reevaluation of the patient after these medicines showed that the patient improved Patients vitals assessed. Upon arrival patient is hemodynamically stable.  I have reviewed the patients home medicines and have  made adjustments as needed   Plan:  F/u for suture removal in 10 days.  Patient is stable for discharge at this time. Patient/family expressed understanding of return precautions and need for follow-up.  Keep wound clean, covered. Educated on sx of concern regarding complications -- such as infection, poor wound healing, compartment sx.          Final Clinical Impression(s) / ED Diagnoses Final diagnoses:  Laceration of right hand without foreign body, initial encounter    Rx / DC Orders ED Discharge Orders     None         Erin Frank 02/08/24 1919    Ninetta Basket, MD 02/09/24 [phone]

## 2024-02-08 NOTE — ED Notes (Signed)
Lido/suture cart at bedside.
